# Patient Record
Sex: Female | Born: 2000 | Race: White | Hispanic: No | Marital: Single | Smoking: Former smoker
Health system: Southern US, Community
[De-identification: ages and names within clinical notes are randomized; demographics above are authoritative.]

---

## 2020-01-23 DIAGNOSIS — Z30011 Encounter for initial prescription of contraceptive pills: Secondary | ICD-10-CM | POA: Diagnosis not present

## 2020-01-23 DIAGNOSIS — Z113 Encounter for screening for infections with a predominantly sexual mode of transmission: Secondary | ICD-10-CM | POA: Diagnosis not present

## 2020-03-15 DIAGNOSIS — Z111 Encounter for screening for respiratory tuberculosis: Secondary | ICD-10-CM | POA: Diagnosis not present

## 2020-03-15 DIAGNOSIS — Z23 Encounter for immunization: Secondary | ICD-10-CM | POA: Diagnosis not present

## 2020-03-15 DIAGNOSIS — Z0000 Encounter for general adult medical examination without abnormal findings: Secondary | ICD-10-CM | POA: Diagnosis not present

## 2020-03-15 DIAGNOSIS — R311 Benign essential microscopic hematuria: Secondary | ICD-10-CM | POA: Diagnosis not present

## 2020-03-15 DIAGNOSIS — Z789 Other specified health status: Secondary | ICD-10-CM | POA: Diagnosis not present

## 2020-03-17 DIAGNOSIS — Z23 Encounter for immunization: Secondary | ICD-10-CM | POA: Diagnosis not present

## 2020-06-29 DIAGNOSIS — H66001 Acute suppurative otitis media without spontaneous rupture of ear drum, right ear: Secondary | ICD-10-CM | POA: Diagnosis not present

## 2020-09-10 DIAGNOSIS — R002 Palpitations: Secondary | ICD-10-CM | POA: Diagnosis not present

## 2020-09-10 DIAGNOSIS — F419 Anxiety disorder, unspecified: Secondary | ICD-10-CM | POA: Diagnosis not present

## 2020-11-13 DIAGNOSIS — R059 Cough, unspecified: Secondary | ICD-10-CM | POA: Diagnosis not present

## 2020-12-10 ENCOUNTER — Ambulatory Visit: Payer: Self-pay | Admitting: Cardiology

## 2020-12-10 ENCOUNTER — Encounter: Payer: Self-pay | Admitting: Cardiology

## 2020-12-10 VITALS — BP 115/56 | HR 132 | Ht 61.0 in | Wt 98.4 lb

## 2020-12-10 DIAGNOSIS — R002 Palpitations: Secondary | ICD-10-CM

## 2020-12-10 NOTE — Progress Notes (Signed)
  Megan Adkins had sudden onset palpitation, symptoms have been ongoing for several years and she gets occasional episodes of rapid palpitation.  EKG essentially revealed sinus tachycardia, this was terminated with Valsalva maneuver.  Advised her to avoid any tobacco products, excess caffeine and any stimulants.  Physical examination grossly normal.  EKG 12/10/2020: Sinus tachycardia rate of 129 bpm, otherwise normal EKG.    ICD-10-CM   1. Palpitations  R00.2 EKG 12-Lead    Yates Decamp, MD, [facility] 12/10/2020, 2:23 PM Office: [phone] Pager: [phone]

## 2020-12-13 ENCOUNTER — Ambulatory Visit: Payer: Self-pay | Admitting: Cardiology

## 2021-01-05 ENCOUNTER — Encounter: Payer: Self-pay | Admitting: Cardiology

## 2021-01-05 ENCOUNTER — Other Ambulatory Visit: Payer: Self-pay

## 2021-01-05 ENCOUNTER — Ambulatory Visit: Payer: Self-pay | Admitting: Cardiology

## 2021-01-05 VITALS — BP 118/72 | HR 119 | Ht 61.0 in | Wt 100.0 lb

## 2021-01-05 DIAGNOSIS — F410 Panic disorder [episodic paroxysmal anxiety] without agoraphobia: Secondary | ICD-10-CM

## 2021-01-05 DIAGNOSIS — R000 Tachycardia, unspecified: Secondary | ICD-10-CM | POA: Insufficient documentation

## 2021-01-05 DIAGNOSIS — R002 Palpitations: Secondary | ICD-10-CM

## 2021-01-05 MED ORDER — METOPROLOL TARTRATE 25 MG PO TABS: 25.0000 mg | ORAL_TABLET | ORAL | 1 refills | Status: DC | PRN

## 2021-01-05 NOTE — Addendum Note (Signed)
Addended by: Elder Negus on: 01/05/2021 04:54 PM   Modules accepted: Level of Service

## 2021-01-05 NOTE — Progress Notes (Signed)
   Follow up visit  Subjective:   Megan Adkins, female    DOB: 2000/12/17, 20 y.o.   MRN: [id number]    HPI  Chief Complaint  Patient presents with  . Tachycardia  . Palpitations  . Follow-up    20 y.o. Caucasian female with tachcyardia  Patient works as a Engineer, site. She has had episodes of anxiety for many years. These episodes are described as tachycardia, blurry vision, numbness all over the body, sensation of "throat closing:". She denies chest pain, shortness of breath, leg edema, orthopnea, PND, TIA/syncope. She tried Valsalva maneuvers today, but did not help.    Current Outpatient Medications on File Prior to Visit  Medication Sig Dispense Refill  . hydrOXYzine (ATARAX/VISTARIL) 10 MG tablet Take 10 mg by mouth as needed.     No current facility-administered medications on file prior to visit.    Cardiovascular & other pertient studies:  EKG 01/05/2021: Sinus tachycardia 132 bpm  RSR(V1) -probably normal for age  Recent labs: Not available   Review of Systems  Cardiovascular: Positive for palpitations. Negative for chest pain, dyspnea on exertion, leg swelling and syncope.  Neurological: Positive for numbness.  Psychiatric/Behavioral: The patient is nervous/anxious.          Vitals:   01/05/21 1334  BP: 118/72  Pulse: (!) 119  SpO2: 100%    Body mass index is 18.89 kg/m. Filed Weights   01/05/21 1334  Weight: 100 lb (45.4 kg)     Objective:   Physical Exam Vitals and nursing note reviewed.  Constitutional:      General: She is not in acute distress. Neck:     Vascular: No JVD.  Cardiovascular:     Rate and Rhythm: Regular rhythm. Tachycardia present.     Pulses: Normal pulses.     Heart sounds: Normal heart sounds. No murmur heard.   Pulmonary:     Effort: Pulmonary effort is normal.     Breath sounds: Normal breath sounds. No wheezing or rales.  Musculoskeletal:     Right lower leg: No edema.     Left lower leg: No edema.            Assessment & Recommendations:   20 y.o. Caucasian female with tachycardia  Sinus tachycardia episodes likely precipitated by anxiety/panic attack. Discussed Valsalva maneuvers. Also recommend liberal hydration, regular exercise for better conditioning. Avoid tobacco, reduce caffeine. Consider management of underlying anxiety. Prescribed metoprolol tartrate 25 mg as needed  Symptoms eventually improved with Valsalva maneuver   F/u as needed   Elder Negus, MD Pager: [phone] Office: [phone]

## 2021-01-05 NOTE — Addendum Note (Signed)
Addended by: Elder Negus on: 01/05/2021 05:02 PM   Modules accepted: Level of Service

## 2021-01-26 ENCOUNTER — Other Ambulatory Visit: Payer: Self-pay

## 2021-01-26 DIAGNOSIS — F410 Panic disorder [episodic paroxysmal anxiety] without agoraphobia: Secondary | ICD-10-CM

## 2021-01-26 DIAGNOSIS — R000 Tachycardia, unspecified: Secondary | ICD-10-CM

## 2021-01-26 MED ORDER — METOPROLOL TARTRATE 25 MG PO TABS: 25.0000 mg | ORAL_TABLET | ORAL | 1 refills | Status: DC | PRN

## 2021-05-24 ENCOUNTER — Ambulatory Visit (INDEPENDENT_AMBULATORY_CARE_PROVIDER_SITE_OTHER): Payer: BC Managed Care – PPO | Admitting: Sports Medicine

## 2021-05-24 ENCOUNTER — Other Ambulatory Visit: Payer: Self-pay

## 2021-05-24 ENCOUNTER — Ambulatory Visit
Admission: RE | Admit: 2021-05-24 | Discharge: 2021-05-24 | Disposition: A | Discharge status: Home / Self Care | Payer: BC Managed Care – PPO | Source: Ambulatory Visit | Attending: Sports Medicine | Admitting: Sports Medicine

## 2021-05-24 VITALS — BP 112/70 | Ht 62.0 in | Wt 103.0 lb

## 2021-05-24 DIAGNOSIS — G8929 Other chronic pain: Secondary | ICD-10-CM

## 2021-05-24 DIAGNOSIS — M545 Low back pain, unspecified: Secondary | ICD-10-CM

## 2021-05-24 DIAGNOSIS — M546 Pain in thoracic spine: Secondary | ICD-10-CM | POA: Diagnosis not present

## 2021-05-24 MED ORDER — MELOXICAM 15 MG PO TABS: 15.0000 mg | ORAL_TABLET | Freq: Every day | ORAL | 0 refills | Status: DC | PRN
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
## 2021-05-30 ENCOUNTER — Other Ambulatory Visit: Payer: Self-pay

## 2021-05-30 DIAGNOSIS — G8929 Other chronic pain: Secondary | ICD-10-CM

## 2021-05-30 DIAGNOSIS — M545 Low back pain, unspecified: Secondary | ICD-10-CM

## 2021-05-30 DIAGNOSIS — M546 Pain in thoracic spine: Secondary | ICD-10-CM

## 2021-06-07 ENCOUNTER — Other Ambulatory Visit: Payer: Self-pay

## 2021-06-07 ENCOUNTER — Ambulatory Visit: Payer: BC Managed Care – PPO | Attending: Sports Medicine

## 2021-06-07 DIAGNOSIS — R293 Abnormal posture: Secondary | ICD-10-CM | POA: Diagnosis not present

## 2021-06-07 DIAGNOSIS — M542 Cervicalgia: Secondary | ICD-10-CM | POA: Insufficient documentation

## 2021-06-07 DIAGNOSIS — M546 Pain in thoracic spine: Secondary | ICD-10-CM | POA: Insufficient documentation
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
## 2021-06-07 NOTE — Patient Instructions (Addendum)
  Access Code: [email or web address] URL: [email or web address] Date: 06/07/2021 Prepared by: Sharol Roussel  Exercises Prone Scapular Slide with Shoulder Extension - 2 x daily - 7 x weekly - 1 sets - 15 reps - 5 hold Seated Thoracic Extension at Wall - 2 x daily - 7 x weekly - 1 sets - 10 reps - 10 hold Standing Lower Cervical and Upper Thoracic Stretch - 1 x daily - 7 x weekly - 1 sets - 3 reps - 20 hold

## 2021-06-14 ENCOUNTER — Ambulatory Visit: Payer: BC Managed Care – PPO

## 2021-06-14 ENCOUNTER — Other Ambulatory Visit: Payer: Self-pay

## 2021-06-14 DIAGNOSIS — M542 Cervicalgia: Secondary | ICD-10-CM

## 2021-06-14 DIAGNOSIS — R293 Abnormal posture: Secondary | ICD-10-CM | POA: Diagnosis not present

## 2021-06-14 DIAGNOSIS — M546 Pain in thoracic spine: Secondary | ICD-10-CM

## 2021-06-14 NOTE — Therapy (Signed)
[facility] [address] [place], [place], [address] Phone: [phone]   Fax:  [phone]  Physical Therapy Treatment  Patient Details  Name: Megan Adkins MRN: [id number] Date of Birth: 2001-02-03 Referring Provider (PT): Ralene Cork, DO   Encounter Date: 06/14/2021   PT End of Session - 06/14/21 1701     Visit Number 2    Number of Visits 12    Date for PT Re-Evaluation 07/19/21    Authorization Type BCBS - 6th visit FOTO, 10th visit FOTO    PT Start Time 1702    PT Stop Time 1744    PT Time Calculation (min) 42 min    Activity Tolerance Patient tolerated treatment well    Behavior During Therapy [facility] for tasks assessed/performed             History reviewed. No pertinent past medical history.  History reviewed. No pertinent surgical history.  There were no vitals filed for this visit.   Subjective Assessment - 06/14/21 1704     Subjective She reports feeling a little bit better, not having as much pain. Has been stretching morning and night. She is having issues with her sciatic nerves.    Currently in Pain? No/denies                [facility] PT Assessment - 06/14/21 0001       Observation/Other Assessments   Focus on Therapeutic Outcomes (FOTO)  51% to 66%               [facility] Adult PT Treatment/Exercise:  Therapeutic Exercise: - cat/camel 1 x 10  - upper trap stretch 30 sec each - levator scap stretch 30 sec each  -prone scapular retraction 10 reps  - thoracic extension on roller 1  x10  -thread the needle 1 x 5 each  Resisted shoulder extension 2 x 10 red band   Manual Therapy: - STM to bilateral thoracic paraspinals - CPAs grade II-III lower cervical, upper thoracic  Neuromuscular re-ed: - n/a  Therapeutic Activity: - n/a  Self-care/Home Management: - Reviewed FOTO score - Updated HEP                     PT Education - 06/14/21 1738     Education Details  see self care    Person(s) Educated Patient    Methods Explanation;Handout;Demonstration;Verbal cues    Comprehension Verbalized understanding;Returned demonstration;Verbal cues required              PT Short Term Goals - 06/07/21 1654       PT SHORT TERM GOAL #1   Title The patient will be independent in a basic HEP for strengthening    Baseline stretches    Time 2    Period Weeks    Status New    Target Date 06/21/21               PT Long Term Goals - 06/07/21 1723       PT LONG TERM GOAL #1   Title The patient will present with improvement of neck flexion motion to 50 degrees.    Baseline 40 degrees    Time 6    Period Weeks    Status New    Target Date 07/19/21      PT LONG TERM GOAL #2   Title The patient will present with improvement of scapular retraction strength to 4+/5 for postural improvement    Baseline  3+/5    Time 6    Period Weeks    Status New    Target Date 07/19/21      PT LONG TERM GOAL #3   Title The patient will present with improvement of mid thoracic joint vertebral mobility to [facility] for postural correction and pain reduction.    Baseline Hypomobile mid thoracic    Time 6    Period Weeks    Status New    Target Date 07/19/21      PT LONG TERM GOAL #4   Title The patient will report pain at the end of the day to a 3/10 for 5 consecutive days.    Baseline 7/10 at end of day    Time 6    Period Weeks    Status New    Target Date 07/19/21      PT LONG TERM GOAL #5   Title The patient will have an improved FOTO score to 69% for functional activities    Baseline 53%    Time 6    Period Weeks    Status New    Target Date 07/19/21                   Plan - 06/14/21 1706     Clinical Impression Statement FOTO was retaken today because when attempting to review her baseline score at the beginning of session on the FOTO website it shows that her survey was not completed despite the evaluating therapist recording a current  and predicted score at her initial evaluation. Today she scored at 51% function. Tautness and palpable tenderness present about bilateral thoracic paraspinals with partial release from manual therapy. Hypomobility present about lower C-spine and upper T-spine. Session focused on improving spinal mobility and progressing periscapular strengthing with patient tolerating session well today, though quickly fatigues with strengthening.    PT Treatment/Interventions ADLs/Self Care Home Management;Electrical Stimulation;Iontophoresis 4mg /ml Dexamethasone;Moist Heat;Traction;Neuromuscular re-education;Therapeutic exercise;Therapeutic activities;Gait training;Stair training;Patient/family education;Manual techniques;Taping;Spinal Manipulations;Joint Manipulations    PT Next Visit Plan review HEP, neck stretches, upper back strengthening, cervical stabilization.    PT Home Exercise Plan Access Code: [email or web address]    Consulted and Agree with Plan of Care Patient             Patient will benefit from skilled therapeutic intervention in order to improve the following deficits and impairments:  Postural dysfunction, Pain, Decreased strength, Decreased mobility, Improper body mechanics  Visit Diagnosis: Pain in thoracic spine  Neck pain  Abnormal posture     Problem List Patient Active Problem List   Diagnosis Date Noted   Sinus tachycardia 01/05/2021   Palpitations 01/05/2021   Panic attack 01/05/2021   01/07/2021, PT, DPT, ATC 06/14/21 5:46 PM   [facility] [address] [place], [place], [place] Phone: [phone]   Fax:  [phone]  Name: Megan Adkins MRN: Ellwood Handler Date of Birth: 01-18-2001

## 2021-06-20 ENCOUNTER — Other Ambulatory Visit: Payer: Self-pay | Admitting: Sports Medicine

## 2021-06-21 ENCOUNTER — Other Ambulatory Visit: Payer: Self-pay

## 2021-06-21 ENCOUNTER — Ambulatory Visit: Payer: BC Managed Care – PPO

## 2021-06-21 DIAGNOSIS — R293 Abnormal posture: Secondary | ICD-10-CM

## 2021-06-21 DIAGNOSIS — M546 Pain in thoracic spine: Secondary | ICD-10-CM

## 2021-06-21 DIAGNOSIS — M542 Cervicalgia: Secondary | ICD-10-CM

## 2021-06-21 NOTE — Therapy (Addendum)
[place], [place], [address] Phone: [phone]   Fax:  [phone]  Physical Therapy Treatment/Discharge  Patient Details  Name: Megan Adkins MRN: [id number] Date of Birth: September 01, 2001 Referring Provider (PT): Thurman Coyer, DO   Encounter Date: 06/21/2021   PT End of Session - 06/21/21 1701     Visit Number 3    Number of Visits 12    Date for PT Re-Evaluation 07/19/21    Authorization Type [employer] - 6th visit FOTO, 10th visit FOTO    PT Start Time 1701    PT Stop Time 1743    PT Time Calculation (min) 42 min    Activity Tolerance Patient tolerated treatment well    Behavior During Therapy [facility] for tasks assessed/performed             History reviewed. No pertinent past medical history.  History reviewed. No pertinent surgical history.  There were no vitals filed for this visit.   Subjective Assessment - 06/21/21 1701     Subjective Patient reports having restless leg in the left leg since last night. She reports her neck and back pain is not that bad right now, but after last session was having pain for a few days after last session. She has appointment with orthopedics on 07/11/21.    Currently in Pain? Yes    Pain Score 5     Pain Location Leg    Pain Orientation Left    Pain Descriptors / Indicators Restless    Pain Type Acute pain    Pain Onset Yesterday    Pain Frequency Constant                       [facility] Adult PT Treatment/Exercise:  Therapeutic Exercise: - UBE 2 min each fwd/bwd level 1 - seated thoracic extension 1 x 10  - cat/camel 1 x 10  - Cervical retraction 2 x 10  - cervical extension SNAG 1 x 10  - upper trap stretch 30 sec each - sidelying open book 1 x 10   Not today: - levator scap stretch 30 sec each  -prone scapular retraction 10 reps  - thoracic extension on roller 1  x10  -thread the needle 1 x 5 each  Resisted shoulder extension 2 x 10 red  band   Manual Therapy: - STM to bilateral thoracic paraspinals - CPAs grade II-V lower cervical, upper thoracic    Neuromuscular re-ed: - n/a  Therapeutic Activity: - n/a  Self-care/Home Management: - n/a                    PT Short Term Goals - 06/21/21 1740       PT SHORT TERM GOAL #1   Title The patient will be independent in a basic HEP for strengthening    Baseline cues for stretching    Time 2    Period Weeks    Status On-going    Target Date 06/21/21               PT Long Term Goals - 06/07/21 1723       PT LONG TERM GOAL #1   Title The patient will present with improvement of neck flexion motion to 50 degrees.    Baseline 40 degrees    Time 6    Period Weeks    Status New    Target Date 07/19/21      PT  LONG TERM GOAL #2   Title The patient will present with improvement of scapular retraction strength to 4+/5 for postural improvement    Baseline 3+/5    Time 6    Period Weeks    Status New    Target Date 07/19/21      PT LONG TERM GOAL #3   Title The patient will present with improvement of mid thoracic joint vertebral mobility to [facility] for postural correction and pain reduction.    Baseline Hypomobile mid thoracic    Time 6    Period Weeks    Status New    Target Date 07/19/21      PT LONG TERM GOAL #4   Title The patient will report pain at the end of the day to a 3/10 for 5 consecutive days.    Baseline 7/10 at end of day    Time 6    Period Weeks    Status New    Target Date 07/19/21      PT LONG TERM GOAL #5   Title The patient will have an improved FOTO score to 69% for functional activities    Baseline 53%    Time 6    Period Weeks    Status New    Target Date 07/19/21                   Plan - 06/21/21 1726     Clinical Impression Statement Unable to elicit cavitation with lower cervical/upper thoracic manipulation. She continues to have significant hypomobility about the lower C-spine/upper T-spine  with patient reporting the most tenderness at T4-T5. She reports discomfort with upper trap stretching, so was encouraged to not pull with as much force to decrease the discomfort. She has difficulty initially with chin tuck as she has tendency to complete cervical extension as opposed to retraction. With continued practice she is able to properly perform.    PT Treatment/Interventions ADLs/Self Care Home Management;Electrical Stimulation;Iontophoresis 25m/ml Dexamethasone;Moist Heat;Traction;Neuromuscular re-education;Therapeutic exercise;Therapeutic activities;Gait training;Stair training;Patient/family education;Manual techniques;Taping;Spinal Manipulations;Joint Manipulations    PT Next Visit Plan review HEP, neck stretches, upper back strengthening, cervical stabilization.    PT Home Exercise Plan Access Code: [email or web address]            Patient will benefit from skilled therapeutic intervention in order to improve the following deficits and impairments:  Postural dysfunction, Pain, Decreased strength, Decreased mobility, Improper body mechanics  Visit Diagnosis: Pain in thoracic spine  Neck pain  Abnormal posture     Problem List Patient Active Problem List   Diagnosis Date Noted   Sinus tachycardia 01/05/2021   Palpitations 01/05/2021   Panic attack 01/05/2021   SGwendolyn Grant PT, DPT, ATC 06/21/21 5:44 PM PHYSICAL THERAPY DISCHARGE SUMMARY  Visits from Start of Care: 3  Current functional level related to goals / functional outcomes: No formal re-assessment of goals   Remaining deficits: Status unknown   Education / Equipment: N/a   Patient agrees to discharge. Patient goals were not met. Patient is being discharged due to not returning since the last visit.  SGwendolyn Grant PT, DPT, ATC 07/27/21 3:07 PM  CM[facility]CBel[facility] LLC[address]GSat[address] [place] [address]Phone: [phone]  Fax:  [phone] Name: Megan OmaraMRN: [id number] of Birth: 412/26/2002

## 2021-06-22 ENCOUNTER — Other Ambulatory Visit: Payer: Self-pay | Admitting: *Deleted

## 2021-06-22 ENCOUNTER — Other Ambulatory Visit: Payer: Self-pay

## 2021-06-22 MED ORDER — MELOXICAM 15 MG PO TABS: 15.0000 mg | ORAL_TABLET | Freq: Every day | ORAL | 0 refills | Status: AC | PRN

## 2021-06-22 MED ORDER — HYDROXYZINE HCL 10 MG PO TABS: 10.0000 mg | ORAL_TABLET | ORAL | 0 refills | Status: DC | PRN

## 2021-06-23 DIAGNOSIS — R636 Underweight: Secondary | ICD-10-CM | POA: Diagnosis not present

## 2021-06-23 DIAGNOSIS — R5383 Other fatigue: Secondary | ICD-10-CM | POA: Diagnosis not present

## 2021-06-23 DIAGNOSIS — D649 Anemia, unspecified: Secondary | ICD-10-CM | POA: Diagnosis not present

## 2021-06-23 DIAGNOSIS — G2581 Restless legs syndrome: Secondary | ICD-10-CM | POA: Diagnosis not present

## 2021-07-02 ENCOUNTER — Ambulatory Visit: Payer: BC Managed Care – PPO | Attending: Sports Medicine

## 2021-07-02 ENCOUNTER — Telehealth: Payer: Self-pay

## 2021-07-02 NOTE — Telephone Encounter (Signed)
Spoke with patient regarding missed PT appointment. She reports she forgot about appointment and is currently out of town. Confirmed next scheduled visit.

## 2021-07-05 ENCOUNTER — Ambulatory Visit: Payer: BC Managed Care – PPO

## 2021-07-19 ENCOUNTER — Telehealth: Payer: Self-pay | Admitting: Cardiology

## 2021-07-19 DIAGNOSIS — F411 Generalized anxiety disorder: Secondary | ICD-10-CM | POA: Diagnosis not present

## 2021-07-19 DIAGNOSIS — F17290 Nicotine dependence, other tobacco product, uncomplicated: Secondary | ICD-10-CM

## 2021-07-19 DIAGNOSIS — K219 Gastro-esophageal reflux disease without esophagitis: Secondary | ICD-10-CM | POA: Diagnosis not present

## 2021-07-19 DIAGNOSIS — R636 Underweight: Secondary | ICD-10-CM | POA: Diagnosis not present

## 2021-07-19 DIAGNOSIS — R002 Palpitations: Secondary | ICD-10-CM | POA: Diagnosis not present

## 2021-07-19 MED ORDER — BUPROPION HCL 100 MG PO TABS: 50.0000 mg | ORAL_TABLET | Freq: Two times a day (BID) | ORAL | 3 refills | Status: AC

## 2021-07-19 MED ORDER — NICOTINE 14 MG/24HR TD PT24: 14.0000 mg | MEDICATED_PATCH | Freq: Every day | TRANSDERMAL | 0 refills | Status: AC

## 2021-07-19 NOTE — Telephone Encounter (Signed)
Patient with palpitations, sinus tachycardia, using Vape almost 1 pack of cigarettes a day, now wants to quit smoking.  I discussed the side effects of Wellbutrin, also nicotine patches, counseling performed.  Rx sent.    ICD-10-CM   1. Other tobacco product nicotine dependence, uncomplicated  F17.290 nicotine (NICODERM CQ) 14 mg/24hr patch    buPROPion (WELLBUTRIN) 100 MG tablet     Meds ordered this encounter  Medications   nicotine (NICODERM CQ) 14 mg/24hr patch    Sig: Place 1 patch (14 mg total) onto the skin daily.    Dispense:  28 patch    Refill:  0   buPROPion (WELLBUTRIN) 100 MG tablet    Sig: Take 0.5 tablets (50 mg total) by mouth 2 (two) times daily. Am and 4 pm. Start once daily for 3 days    Dispense:  30 tablet    Refill:  3     Yates Decamp, MD, [facility] 07/19/2021, 1:09 PM Office: [phone] Fax: [phone] Pager: [phone]

## 2021-09-27 DIAGNOSIS — D509 Iron deficiency anemia, unspecified: Secondary | ICD-10-CM | POA: Diagnosis not present

## 2022-01-05 DIAGNOSIS — J069 Acute upper respiratory infection, unspecified: Secondary | ICD-10-CM | POA: Diagnosis not present

## 2022-01-05 DIAGNOSIS — R059 Cough, unspecified: Secondary | ICD-10-CM | POA: Diagnosis not present

## 2022-01-05 DIAGNOSIS — R0981 Nasal congestion: Secondary | ICD-10-CM | POA: Diagnosis not present

## 2022-01-23 ENCOUNTER — Inpatient Hospital Stay: Payer: Self-pay

## 2022-01-23 ENCOUNTER — Other Ambulatory Visit: Payer: Self-pay | Admitting: Student

## 2022-01-23 DIAGNOSIS — R002 Palpitations: Secondary | ICD-10-CM

## 2022-01-23 NOTE — Progress Notes (Signed)
Patient complaining of frequent palpitations over the last 3 to 4 days with associated mild chest discomfort.  We will obtain 3 day cardiac monitor to evaluate for underlying cardiac arrhythmias. ? ?  ICD-10-CM   ?1. Palpitations  R00.2 LONG TERM MONITOR (3-14 DAYS)  ?  ? ? ?Rayford Halsted, PA-C ?01/23/2022, 11:25 AM ?Office: [phone] ? ?

## 2022-02-02 ENCOUNTER — Ambulatory Visit: Payer: Self-pay | Admitting: Cardiology

## 2022-02-02 DIAGNOSIS — F410 Panic disorder [episodic paroxysmal anxiety] without agoraphobia: Secondary | ICD-10-CM

## 2022-02-02 DIAGNOSIS — R002 Palpitations: Secondary | ICD-10-CM

## 2022-02-02 DIAGNOSIS — R000 Tachycardia, unspecified: Secondary | ICD-10-CM

## 2022-02-02 MED ORDER — METOPROLOL TARTRATE 25 MG PO TABS: 25.0000 mg | ORAL_TABLET | ORAL | 2 refills | Status: DC | PRN

## 2022-02-02 NOTE — Progress Notes (Signed)
? ?  Follow up visit ? ?Subjective:  ? ?Megan Adkins, female    DOB: December 20, 2000, 21 y.o.   MRN: [id number] ? ? ? ?HPI ? ?Chief Complaint  ?Patient presents with  ? Dizziness  ? Follow-up  ? Palpitations  ? ? ?21 y.o. Caucasian female with tachcyardia ? ?Recent stress related to work and studies. She has been having episodes of palpitations, neck stiffness, lightheadedness from time to time.  ? ? ?Current Outpatient Medications on File Prior to Visit  ?Medication Sig Dispense Refill  ? buPROPion (WELLBUTRIN) 100 MG tablet Take 0.5 tablets (50 mg total) by mouth 2 (two) times daily. Am and 4 pm. Start once daily for 3 days 30 tablet 3  ? meloxicam (MOBIC) 15 MG tablet Take 1 tablet (15 mg total) by mouth daily as needed for pain. Take with food. 30 tablet 0  ? metoprolol tartrate (LOPRESSOR) 25 MG tablet Take 1 tablet (25 mg total) by mouth as needed. 60 tablet 1  ? nicotine (NICODERM CQ) 14 mg/24hr patch Place 1 patch (14 mg total) onto the skin daily. 28 patch 0  ? ?No current facility-administered medications on file prior to visit.  ? ? ?Cardiovascular & other pertient studies: ? ?EKG 01/05/2021: ?Sinus tachycardia 132 bpm  ?RSR(V1) -probably normal for age ? ?Recent labs: ?Not available ? ? ?Review of Systems  ?Cardiovascular:  Positive for palpitations. Negative for chest pain, dyspnea on exertion, leg swelling and syncope.  ?Psychiatric/Behavioral:  The patient is nervous/anxious.   ? ?   ? ? ?Vitals:  ? 02/02/22 1026 02/02/22 1027  ?SpO2: 99% 99%  ? ? ?Orthostatic VS for the past 72 hrs (Last 3 readings): ? Orthostatic BP Patient Position BP Location Cuff Size Orthostatic Pulse  ?02/02/22 1027 (!) 121/104 Standing Left Arm Normal 137  ?02/02/22 1026 121/70 Sitting Left Arm Normal 133  ?02/02/22 1025 117/77 Supine Left Arm Normal 114  ? ? ? ?Objective:  ? Physical Exam ?Vitals and nursing note reviewed.  ?Constitutional:   ?   General: She is not in acute distress. ?Neck:  ?   Vascular: No JVD.  ?Cardiovascular:  ?    Rate and Rhythm: Regular rhythm. Tachycardia present.  ?   Pulses: Normal pulses.  ?   Heart sounds: Normal heart sounds. No murmur heard. ?Pulmonary:  ?   Effort: Pulmonary effort is normal.  ?   Breath sounds: Normal breath sounds. No wheezing or rales.  ?Musculoskeletal:  ?   Right lower leg: No edema.  ?   Left lower leg: No edema.  ? ? ? ? ? ?   ?Assessment & Recommendations:  ? ?21 y.o. Caucasian female with tachycardia ? ?Sinus tachycardia on EKG.  Suspect anxiety, inappropriate sinus tachycardia, possible POTS. ?Recommend liberal hydration, compression stockings. Okay to use metoprolol tartrate for as needed purpose. ? ?F/u as needed ? ? ?Nigel Mormon, MD ?Pager: [phone] ?Office: [phone] ?

## 2022-02-08 IMAGING — DX DG LUMBAR SPINE 2-3V
2 series · 2 of 2 positions shown · non-contrast
Comparison: None.

CLINICAL DATA: Midthoracic and lower lumbar spine pain.

EXAM:
LUMBAR SPINE - 2-3 VIEW

[dg lumbar spine 2-3 views (1 of 2)]
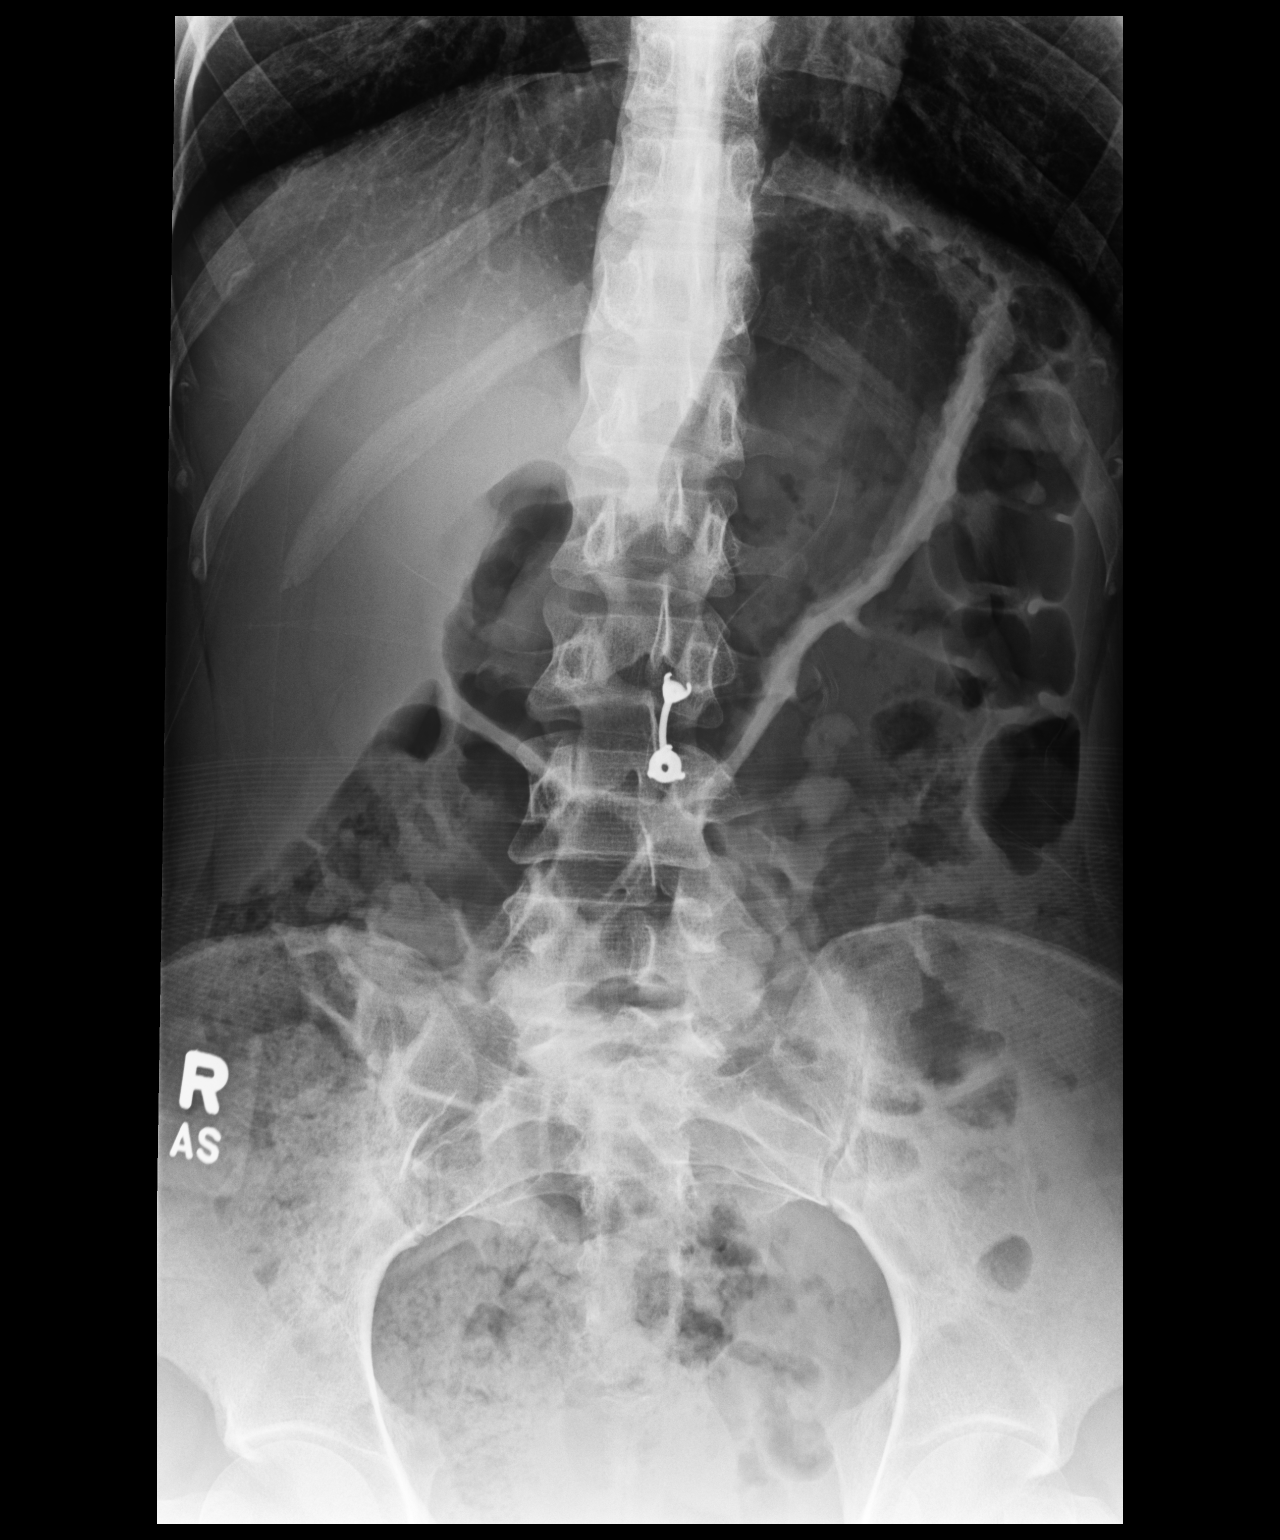

[dg lumbar spine 2-3 views (2 of 2)]
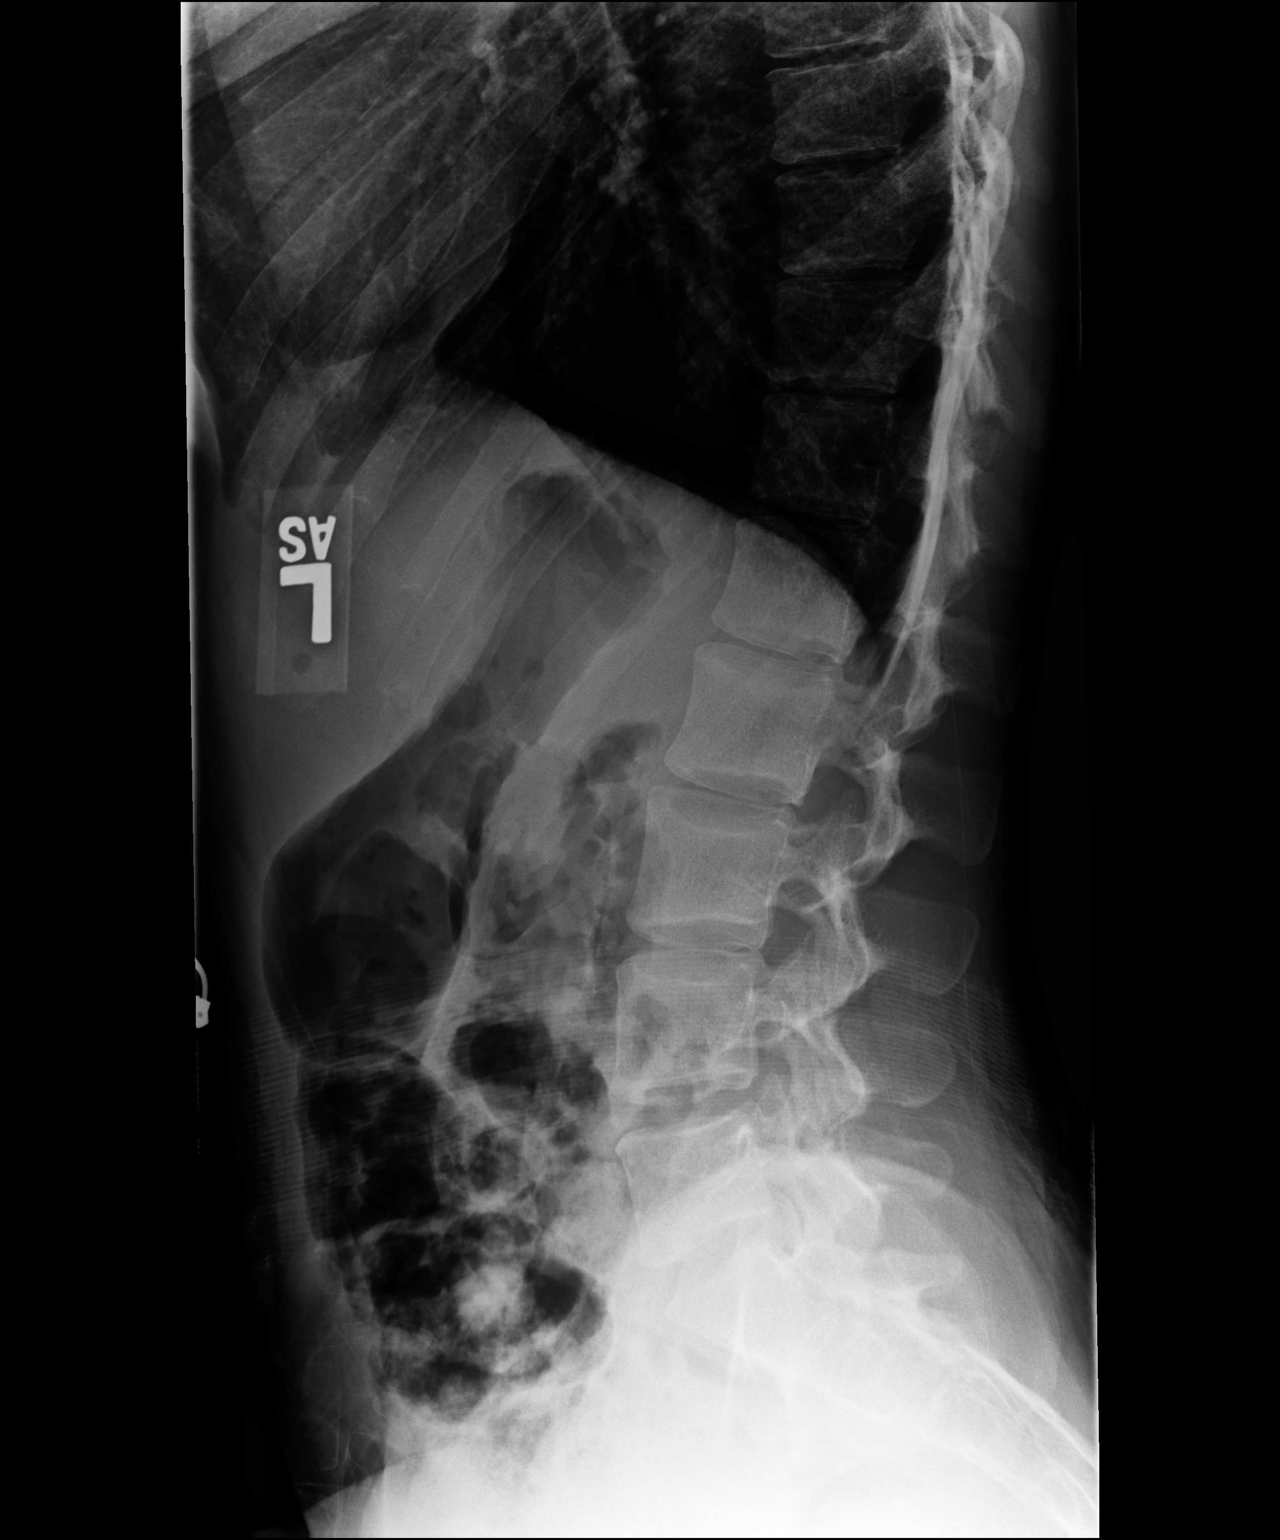

[2 of 2 positions shown; findings below may reference images not displayed]

FINDINGS: There is no evidence of lumbar spine fracture. Alignment is normal.
Intervertebral disc spaces are maintained. Umbilical ornamentation.
IMPRESSION: Negative.

## 2022-02-14 ENCOUNTER — Other Ambulatory Visit: Payer: Self-pay | Admitting: Student

## 2022-02-14 DIAGNOSIS — B9689 Other specified bacterial agents as the cause of diseases classified elsewhere: Secondary | ICD-10-CM

## 2022-02-14 MED ORDER — AMOXICILLIN-POT CLAVULANATE 500-125 MG PO TABS: 500.0000 mg | ORAL_TABLET | Freq: Three times a day (TID) | ORAL | 0 refills | Status: DC

## 2022-02-14 NOTE — Progress Notes (Signed)
Patient complaining of nasal congestion, nasal drainage, cough, fatigue, and headaches over the last 2 weeks patient states initially she saw mild improvement of symptoms with symptomatic management including intranasal steroids and oral decongestions.  However patient's symptoms worsened over the last 8 to 10 days.  ? ?Suspect bacterial rhinosinusitis, will therefore give patient a course of Augmentin 500 mg every 8 hours for 5 days. ?

## 2022-02-17 ENCOUNTER — Other Ambulatory Visit: Payer: Self-pay

## 2022-02-17 MED ORDER — AMOXICILLIN-POT CLAVULANATE 500-125 MG PO TABS: 500.0000 mg | ORAL_TABLET | Freq: Three times a day (TID) | ORAL | 0 refills | Status: AC

## 2022-05-15 ENCOUNTER — Other Ambulatory Visit: Payer: Self-pay | Admitting: Student

## 2022-05-15 DIAGNOSIS — F410 Panic disorder [episodic paroxysmal anxiety] without agoraphobia: Secondary | ICD-10-CM

## 2022-05-15 DIAGNOSIS — R000 Tachycardia, unspecified: Secondary | ICD-10-CM

## 2022-05-15 MED ORDER — METOPROLOL TARTRATE 25 MG PO TABS: 25.0000 mg | ORAL_TABLET | ORAL | 2 refills | Status: AC | PRN
